# Patient Record
Sex: Female | Born: 1960 | Race: White | Hispanic: No | Smoking: Never smoker
Health system: Southern US, Community
[De-identification: ages and names within clinical notes are randomized; demographics above are authoritative.]

---

## 1999-12-08 ENCOUNTER — Other Ambulatory Visit: Admission: RE | Admit: 1999-12-08 | Discharge: 1999-12-08 | Payer: Self-pay | Admitting: Obstetrics and Gynecology

## 2001-01-01 ENCOUNTER — Other Ambulatory Visit: Admission: RE | Admit: 2001-01-01 | Discharge: 2001-01-01 | Payer: Self-pay | Admitting: Obstetrics and Gynecology

## 2002-01-02 ENCOUNTER — Other Ambulatory Visit: Admission: RE | Admit: 2002-01-02 | Discharge: 2002-01-02 | Payer: Self-pay | Admitting: Obstetrics and Gynecology

## 2002-02-04 ENCOUNTER — Encounter: Admission: RE | Admit: 2002-02-04 | Discharge: 2002-02-04 | Payer: Self-pay | Admitting: Internal Medicine

## 2002-02-04 ENCOUNTER — Encounter: Payer: Self-pay | Admitting: Internal Medicine

## 2002-02-08 ENCOUNTER — Encounter: Admission: RE | Admit: 2002-02-08 | Discharge: 2002-02-08 | Payer: Self-pay | Admitting: Internal Medicine

## 2002-02-08 ENCOUNTER — Encounter: Payer: Self-pay | Admitting: Internal Medicine

## 2003-01-21 ENCOUNTER — Other Ambulatory Visit: Admission: RE | Admit: 2003-01-21 | Discharge: 2003-01-21 | Payer: Self-pay | Admitting: Obstetrics and Gynecology

## 2004-01-23 ENCOUNTER — Other Ambulatory Visit: Admission: RE | Admit: 2004-01-23 | Discharge: 2004-01-23 | Payer: Self-pay | Admitting: Obstetrics and Gynecology

## 2015-10-23 ENCOUNTER — Other Ambulatory Visit (HOSPITAL_BASED_OUTPATIENT_CLINIC_OR_DEPARTMENT_OTHER): Payer: Self-pay | Admitting: Orthopaedic Surgery

## 2015-10-23 DIAGNOSIS — M79642 Pain in left hand: Secondary | ICD-10-CM

## 2015-10-26 ENCOUNTER — Encounter (HOSPITAL_BASED_OUTPATIENT_CLINIC_OR_DEPARTMENT_OTHER): Payer: Self-pay | Admitting: Radiology

## 2015-10-26 ENCOUNTER — Ambulatory Visit (HOSPITAL_BASED_OUTPATIENT_CLINIC_OR_DEPARTMENT_OTHER)
Admission: RE | Admit: 2015-10-26 | Discharge: 2015-10-26 | Disposition: A | Discharge status: Home / Self Care | Payer: BLUE CROSS/BLUE SHIELD | Source: Ambulatory Visit | Attending: Orthopaedic Surgery | Admitting: Orthopaedic Surgery

## 2015-10-26 DIAGNOSIS — M79642 Pain in left hand: Secondary | ICD-10-CM | POA: Insufficient documentation

## 2017-01-04 ENCOUNTER — Other Ambulatory Visit (INDEPENDENT_AMBULATORY_CARE_PROVIDER_SITE_OTHER): Payer: Self-pay

## 2017-01-04 ENCOUNTER — Ambulatory Visit (INDEPENDENT_AMBULATORY_CARE_PROVIDER_SITE_OTHER): Payer: BLUE CROSS/BLUE SHIELD | Admitting: Orthopaedic Surgery

## 2017-01-04 DIAGNOSIS — M79642 Pain in left hand: Secondary | ICD-10-CM | POA: Diagnosis not present

## 2017-01-04 DIAGNOSIS — M79641 Pain in right hand: Secondary | ICD-10-CM

## 2017-01-04 NOTE — Progress Notes (Signed)
Office Visit Note   Patient: Diana Glass           Date of Birth: 05-Oct-1960           MRN: [id number] Visit Date: 01/04/2017              Requested by: Jamal CollinHedgecock, Suzanne, PA-C [address] [place], [place] [address] PCP: No primary care provider on file.   Assessment & Plan: Visit Diagnoses:  1. Pain in left hand   2. Pain in right hand     Plan:Given her multiple joint complaints she may end up needing a rheumatologic referral. However I would like to obtain bilateral upper extremity nerve conduction studies to evaluate for carpal tunnel syndrome given the fact that she gets numbness especially the right side that does wake her up at night and she's been wearing a wrist splint for this. She is agreeable to trying this type of study and then eventually a referral to rheumatologist if needed. All questions concerns were answered and addressed. We'll work on setting up the EMG studies by Dr. Alvester MorinNewton.  Follow-Up Instructions: Return in about 2 weeks (around 01/18/2017).   Orders:  No orders of the defined types were placed in this encounter.  No orders of the defined types were placed in this encounter.     Procedures: No procedures performed   Clinical Data: No additional findings.   Subjective: No chief complaint on file. The patient is someone I have seen before for trigger thumb. However at this point she has bilateral hand. She does wear wrist splint at night on her right hand and does get numbness and tingling on occasion from the elbows down to her hands. Her biggest concern though is whether or not she has rheumatologic process going on. She says her fingers and hand get stiff at night as well as her feet and sometimes her knees. She has seen her primary care physician and had labs obtained and she states that showed a positive ANA. She has x-rays on the canopy system for review of both her hands. She's never had nerve conduction studies before. She takes  anti-inflammatories on an as-needed basis. She is a very slim individual.  HPI  Review of Systems She currently denies any headache, chest pain, shortness of breath, fever, chills, nausea, vomiting. She denies any other systemic illnesses.  Objective: Vital Signs: There were no vitals taken for this visit.  Physical Exam She is alert or 3 and in no acute distress. Ortho Exam Examination of both hands are assessed today and I see no evidence of muscle atrophy. She moves her fingers thumbs and wrists well. I see no significant swelling at the MCP or IP joints. There is not a true positive Tinel's or Phalen's exam bilaterally. She does report some subjective numbness. She states that her fingers are just stiff. Specialty Comments:  No specialty comments available.  Imaging: No results found. I independently reviewed x-rays of both her hands and they're entirely normal in terms of the bony anatomy. The joints are all well maintained. There is no acute findings on either hand or wrist.  PMFS History: Patient Active Problem List   Diagnosis Date Noted  . Pain in left hand 01/04/2017  . Pain in right hand 01/04/2017   No past medical history on file.  No family history on file.  No past surgical history on file. Social History   Occupational History  . Not on file.   Social  History Main Topics  . Smoking status: Not on file  . Smokeless tobacco: Not on file  . Alcohol use Not on file  . Drug use: Unknown  . Sexual activity: Not on file

## 2017-01-18 ENCOUNTER — Encounter (INDEPENDENT_AMBULATORY_CARE_PROVIDER_SITE_OTHER): Payer: Self-pay | Admitting: Physical Medicine and Rehabilitation

## 2017-01-18 ENCOUNTER — Ambulatory Visit (INDEPENDENT_AMBULATORY_CARE_PROVIDER_SITE_OTHER): Payer: BLUE CROSS/BLUE SHIELD | Admitting: Physical Medicine and Rehabilitation

## 2017-01-18 ENCOUNTER — Ambulatory Visit (INDEPENDENT_AMBULATORY_CARE_PROVIDER_SITE_OTHER): Payer: BLUE CROSS/BLUE SHIELD | Admitting: Orthopaedic Surgery

## 2017-01-18 DIAGNOSIS — G5601 Carpal tunnel syndrome, right upper limb: Secondary | ICD-10-CM | POA: Diagnosis not present

## 2017-01-18 DIAGNOSIS — R202 Paresthesia of skin: Secondary | ICD-10-CM

## 2017-01-18 NOTE — Progress Notes (Signed)
The patient is here for follow-up after having nerve conduction studies of her bilateral upper extremities due to bilateral hand pain with the right worse than left.  We felt that she may have a rheumatologic process/condition going on as well given the swelling that she gets in her joints of her fingers.  I talked with Dr. Alvester MorinNewton in detail about her studies.  I shared this with her as well.  Her nerve conduction studies do confirm severe carpal tunnel syndrome of the right upper extremity and only mild in the left upper extremity.  On exam she has a swollen right hand with subjective numbness in the median nerve distribution and pain on all of the finger joints.  She has limited grip strength as well.  I shared with her the results of the nerve conduction studies.  At this point we are recommending a right open carpal tunnel release.  I explained to her in detail with the surgery involves including a thorough discussion of the risk and benefits of surgeries.  I talked about her intraoperative and postoperative course would be as well.  Right now I would not recommend anything for the left side but we would only address the right side operatively.  After this long discussion she decided she does want to have this scheduled.  We will work on setting this up and then we will see her back in 2 weeks postoperative for suture removal.  All questions and concerns were answered and addressed.

## 2017-01-18 NOTE — Progress Notes (Deleted)
Numbness and pain in hands. Numbness is worse with lying down or sitting. Feels like hands are swelling. Right hand is worse. Right hand dominant. Right third finger is the most painful, but numbness is in entire hand.

## 2017-01-19 ENCOUNTER — Encounter (INDEPENDENT_AMBULATORY_CARE_PROVIDER_SITE_OTHER): Payer: Self-pay | Admitting: Physical Medicine and Rehabilitation

## 2017-01-19 NOTE — Procedures (Signed)
EMG & NCV Findings: Evaluation of the right median motor nerve showed prolonged distal onset latency (5.5 ms), reduced amplitude (3.5 mV), and decreased conduction velocity (Elbow-Wrist, 42 m/s).  The left median (across palm) sensory nerve showed prolonged distal peak latency (Wrist, 3.9 ms).  The right median (across palm) sensory nerve showed prolonged distal peak latency (Palm, 5.6 ms).  All remaining nerves (as indicated in the following tables) were within normal limits.  Left vs. Right side comparison data for the median motor nerve indicates abnormal L-R latency difference (1.7 ms), abnormal L-R amplitude difference (65.7 %), and abnormal L-R velocity difference (Elbow-Wrist, 12 m/s).    Needle evaluation of the right abductor pollicis brevis muscle showed increased motor unit amplitude and diminished recruitment.  All remaining muscles (as indicated in the following table) showed no evidence of electrical instability.    Impression: The above electrodiagnostic study is ABNORMAL and reveals evidence of:  1. A severe right median nerve entrapment at the wrist (carpal tunnel syndrome) affecting sensory and motor components.  2.  3. A mild left median nerve entrapment at the wrist (carpal tunnel syndrome) affecting sensory components.   There is no significant electrodiagnostic evidence of any other focal nerve entrapment, brachial plexopathy or generalized peripheral neuropathy.  Recommendations: 1.  Follow-up with referring physician. 2.  Continue current management of symptoms. 3.  Continue use of resting splint at night-time and as needed during the day. 4.  Suggest surgical evaluation.   Nerve Conduction Studies Anti Sensory Summary Table   Stim Site NR Peak (ms) Norm Peak (ms) P-T Amp (V) Norm P-T Amp Site1 Site2 Delta-P (ms) Dist (cm) Vel (m/s) Norm Vel (m/s)  Right Med Ante Brach Cutan Anti Sensory (Med Forearm)  34C  Elbow    3.0  46.5  Elbow Med Forearm 3.0 0.0    Left  Median Acr Palm Anti Sensory (2nd Digit)  33.4C  Wrist    *3.9 <3.6 34.0 >10 Wrist Palm 2.1 0.0    Palm    1.8 <2.0 54.1         Right Median Acr Palm Anti Sensory (2nd Digit)  33.5C  Wrist    2.1 <3.6 22.3 >10 Wrist Palm 3.5 0.0    Palm    *5.6 <2.0 6.8         Right Radial Anti Sensory (Base 1st Digit)  32C  Wrist    1.8 <3.1 35.6  Wrist Base 1st Digit 1.8 0.0     Motor Summary Table   Stim Site NR Onset (ms) Norm Onset (ms) O-P Amp (mV) Norm O-P Amp Site1 Site2 Delta-0 (ms) Dist (cm) Vel (m/s) Norm Vel (m/s)  Left Median Motor (Abd Poll Brev)  34.4C  Wrist    3.8 <4.2 10.2 >5 Elbow Wrist 3.4 18.5 54 >50  Elbow    7.2  9.9         Right Median Motor (Abd Poll Brev)  31.8C  Wrist    *5.5 <4.2 *3.5 >5 Elbow Wrist 4.2 17.8 *42 >50  Elbow    9.7  3.2         Right Ulnar Motor (Abd Dig Min)  32.1C  Wrist    2.8 <4.2 9.8 >3 B Elbow Wrist 2.8 19.0 68 >53  B Elbow    5.6  10.1  A Elbow B Elbow 0.9 9.0 100 >53  A Elbow    6.5  10.0          EMG   Side  Muscle Nerve Root Ins Act Fibs Psw Amp Dur Poly Recrt Int Dennie BiblePat Comment  Right Abd Poll Brev Median C8-T1 Nml Nml Nml *Incr Nml 0 *Reduced Nml   Right 1stDorInt Ulnar C8-T1 Nml Nml Nml Nml Nml 0 Nml Nml   Right PronatorTeres Median C6-7 Nml Nml Nml Nml Nml 0 Nml Nml   Right Biceps Musculocut C5-6 Nml Nml Nml Nml Nml 0 Nml Nml   Right Deltoid Axillary C5-6 Nml Nml Nml Nml Nml 0 Nml Nml     Nerve Conduction Studies Anti Sensory Left/Right Comparison   Stim Site L Lat (ms) R Lat (ms) L-R Lat (ms) L Amp (V) R Amp (V) L-R Amp (%) Site1 Site2 L Vel (m/s) R Vel (m/s) L-R Vel (m/s)  Med Ante Brach Cutan Anti Sensory (Med Forearm)  34C  Elbow  3.0   46.5  Elbow Med Forearm     Median Acr Palm Anti Sensory (2nd Digit)  33.4C  Wrist *3.9 2.1 1.8 34.0 22.3 34.4 Wrist Palm     Palm 1.8 *5.6 3.8 54.1 6.8 87.4       Radial Anti Sensory (Base 1st Digit)  32C  Wrist  1.8   35.6  Wrist Base 1st Digit      Motor Left/Right Comparison    Stim Site L Lat (ms) R Lat (ms) L-R Lat (ms) L Amp (mV) R Amp (mV) L-R Amp (%) Site1 Site2 L Vel (m/s) R Vel (m/s) L-R Vel (m/s)  Median Motor (Abd Poll Brev)  34.4C  Wrist 3.8 *5.5 *1.7 10.2 *3.5 *65.7 Elbow Wrist 54 *42 *12  Elbow 7.2 9.7 2.5 9.9 3.2 67.7       Ulnar Motor (Abd Dig Min)  32.1C  Wrist  2.8   9.8  B Elbow Wrist  68   B Elbow  5.6   10.1  A Elbow B Elbow  100   A Elbow  6.5   10.0           Waveforms:

## 2017-01-19 NOTE — Progress Notes (Signed)
Diana Glass - 56 y.o. female MRN [id number]  Date of birth: [birth year]  Office Visit Note: Visit Date: 01/18/2017 PCP: Jamal Collin, PA-C Referred by: No ref. provider found  Subjective: Chief Complaint  Patient presents with  . Right Hand - Numbness  . Left Hand - Numbness   HPI: Diana Glass is a 56 year old right-hand-dominant who comes in today for electrodiagnostic study requested by Dr. Magnus Ivan for bilateral hand numbness right more than left.  She reports several months of worsening hand pain and numbness and tingling particularly in the right hand essentially a nondermatomal fashion leading up to the elbow.  She gets worsening symptoms at night and with activity.  She does get some numbness with driving and with holding a phone.  She feels like her hands are swelling and she has a hard time making a fist.  She reports that the right third finger is the most painful but numbness is in the entire hand.  She reports no specific injury.    ROS Otherwise per HPI.  Assessment & Plan: Visit Diagnoses:  1. Paresthesia of skin     Plan: No additional findings.  Impression: The above electrodiagnostic study is ABNORMAL and reveals evidence of:  1. A severe right median nerve entrapment at the wrist (carpal tunnel syndrome) affecting sensory and motor components.  2.  3. A mild left median nerve entrapment at the wrist (carpal tunnel syndrome) affecting sensory components.   There is no significant electrodiagnostic evidence of any other focal nerve entrapment, brachial plexopathy or generalized peripheral neuropathy.  Recommendations: 1.  Follow-up with referring physician. 2.  Continue current management of symptoms. 3.  Continue use of resting splint at night-time and as needed during the day. 4.  Suggest surgical evaluation.   Meds & Orders: No orders of the defined types were placed in this encounter.   Orders Placed This Encounter  Procedures  . NCV with EMG  (electromyography)    Follow-up: Return for Dr. Magnus Ivan.   Procedures: No procedures performed  EMG & NCV Findings: Evaluation of the right median motor nerve showed prolonged distal onset latency (5.5 ms), reduced amplitude (3.5 mV), and decreased conduction velocity (Elbow-Wrist, 42 m/s).  The left median (across palm) sensory nerve showed prolonged distal peak latency (Wrist, 3.9 ms).  The right median (across palm) sensory nerve showed prolonged distal peak latency (Palm, 5.6 ms).  All remaining nerves (as indicated in the following tables) were within normal limits.  Left vs. Right side comparison data for the median motor nerve indicates abnormal L-R latency difference (1.7 ms), abnormal L-R amplitude difference (65.7 %), and abnormal L-R velocity difference (Elbow-Wrist, 12 m/s).    Needle evaluation of the right abductor pollicis brevis muscle showed increased motor unit amplitude and diminished recruitment.  All remaining muscles (as indicated in the following table) showed no evidence of electrical instability.    Impression: The above electrodiagnostic study is ABNORMAL and reveals evidence of:  4. A severe right median nerve entrapment at the wrist (carpal tunnel syndrome) affecting sensory and motor components.  5.  6. A mild left median nerve entrapment at the wrist (carpal tunnel syndrome) affecting sensory components.   There is no significant electrodiagnostic evidence of any other focal nerve entrapment, brachial plexopathy or generalized peripheral neuropathy.  Recommendations: 1.  Follow-up with referring physician. 2.  Continue current management of symptoms. 3.  Continue use of resting splint at night-time and as needed during the day. 4.  Suggest surgical evaluation.  Nerve Conduction Studies Anti Sensory Summary Table   Stim Site NR Peak (ms) Norm Peak (ms) P-T Amp (V) Norm P-T Amp Site1 Site2 Delta-P (ms) Dist (cm) Vel (m/s) Norm Vel (m/s)  Right Med Ante  Brach Cutan Anti Sensory (Med Forearm)  34C  Elbow    3.0  46.5  Elbow Med Forearm 3.0 0.0    Left Median Acr Palm Anti Sensory (2nd Digit)  33.4C  Wrist    *3.9 <3.6 34.0 >10 Wrist Palm 2.1 0.0    Palm    1.8 <2.0 54.1         Right Median Acr Palm Anti Sensory (2nd Digit)  33.5C  Wrist    2.1 <3.6 22.3 >10 Wrist Palm 3.5 0.0    Palm    *5.6 <2.0 6.8         Right Radial Anti Sensory (Base 1st Digit)  32C  Wrist    1.8 <3.1 35.6  Wrist Base 1st Digit 1.8 0.0     Motor Summary Table   Stim Site NR Onset (ms) Norm Onset (ms) O-P Amp (mV) Norm O-P Amp Site1 Site2 Delta-0 (ms) Dist (cm) Vel (m/s) Norm Vel (m/s)  Left Median Motor (Abd Poll Brev)  34.4C  Wrist    3.8 <4.2 10.2 >5 Elbow Wrist 3.4 18.5 54 >50  Elbow    7.2  9.9         Right Median Motor (Abd Poll Brev)  31.8C  Wrist    *5.5 <4.2 *3.5 >5 Elbow Wrist 4.2 17.8 *42 >50  Elbow    9.7  3.2         Right Ulnar Motor (Abd Dig Min)  32.1C  Wrist    2.8 <4.2 9.8 >3 B Elbow Wrist 2.8 19.0 68 >53  B Elbow    5.6  10.1  A Elbow B Elbow 0.9 9.0 100 >53  A Elbow    6.5  10.0          EMG   Side Muscle Nerve Root Ins Act Fibs Psw Amp Dur Poly Recrt Int Dennie BiblePat Comment  Right Abd Poll Brev Median C8-T1 Nml Nml Nml *Incr Nml 0 *Reduced Nml   Right 1stDorInt Ulnar C8-T1 Nml Nml Nml Nml Nml 0 Nml Nml   Right PronatorTeres Median C6-7 Nml Nml Nml Nml Nml 0 Nml Nml   Right Biceps Musculocut C5-6 Nml Nml Nml Nml Nml 0 Nml Nml   Right Deltoid Axillary C5-6 Nml Nml Nml Nml Nml 0 Nml Nml     Nerve Conduction Studies Anti Sensory Left/Right Comparison   Stim Site L Lat (ms) R Lat (ms) L-R Lat (ms) L Amp (V) R Amp (V) L-R Amp (%) Site1 Site2 L Vel (m/s) R Vel (m/s) L-R Vel (m/s)  Med Ante Brach Cutan Anti Sensory (Med Forearm)  34C  Elbow  3.0   46.5  Elbow Med Forearm     Median Acr Palm Anti Sensory (2nd Digit)  33.4C  Wrist *3.9 2.1 1.8 34.0 22.3 34.4 Wrist Palm     Palm 1.8 *5.6 3.8 54.1 6.8 87.4       Radial Anti Sensory (Base  1st Digit)  32C  Wrist  1.8   35.6  Wrist Base 1st Digit      Motor Left/Right Comparison   Stim Site L Lat (ms) R Lat (ms) L-R Lat (ms) L Amp (mV) R Amp (mV) L-R Amp (%) Site1 Site2 L Vel (m/s) R Vel (m/s) L-R Vel (m/s)  Median Motor (Abd Poll Brev)  34.4C  Wrist 3.8 *5.5 *1.7 10.2 *3.5 *65.7 Elbow Wrist 54 *42 *12  Elbow 7.2 9.7 2.5 9.9 3.2 67.7       Ulnar Motor (Abd Dig Min)  32.1C  Wrist  2.8   9.8  B Elbow Wrist  68   B Elbow  5.6   10.1  A Elbow B Elbow  100   A Elbow  6.5   10.0           Waveforms:                Clinical History: No specialty comments available.  She reports that she has never smoked. She has never used smokeless tobacco. No results for input(s): HGBA1C, LABURIC in the last 8760 hours.  Objective:  VS:  HT:    WT:   BMI:     BP:   HR: bpm  TEMP: ( )  RESP:  Physical Exam  Musculoskeletal:  Inspection reveals mild flattening of the right APB but no atrophy of the bilateral APB or FDI or hand intrinsics. There is no swelling, color changes, allodynia or dystrophic changes. There is 5 out of 5 strength in the bilateral wrist extension, finger abduction and long finger flexion.  There is a negative Hoffmann's test bilaterally.    Ortho Exam Imaging: No results found.  Past Medical/Family/Surgical/Social History: Medications & Allergies reviewed per EMR Patient Active Problem List   Diagnosis Date Noted  . Right carpal tunnel syndrome 01/18/2017  . Pain in left hand 01/04/2017  . Pain in right hand 01/04/2017   History reviewed. No pertinent past medical history. History reviewed. No pertinent family history. History reviewed. No pertinent surgical history. Social History   Occupational History  . Not on file.   Social History Main Topics  . Smoking status: Never Smoker  . Smokeless tobacco: Never Used  . Alcohol use Not on file  . Drug use: Unknown  . Sexual activity: Not on file

## 2017-02-02 DIAGNOSIS — G5601 Carpal tunnel syndrome, right upper limb: Secondary | ICD-10-CM | POA: Diagnosis not present

## 2017-02-13 ENCOUNTER — Encounter (INDEPENDENT_AMBULATORY_CARE_PROVIDER_SITE_OTHER): Payer: Self-pay | Admitting: Orthopaedic Surgery

## 2017-02-13 ENCOUNTER — Ambulatory Visit (INDEPENDENT_AMBULATORY_CARE_PROVIDER_SITE_OTHER): Payer: BLUE CROSS/BLUE SHIELD | Admitting: Orthopaedic Surgery

## 2017-02-13 DIAGNOSIS — Z9889 Other specified postprocedural states: Secondary | ICD-10-CM

## 2017-02-13 NOTE — Progress Notes (Signed)
The patient is now 2 weeks status post a right open carpal tunnel release.  She is doing well overall.  On examination her incision looks good remove the sutures in place gestures.  There is bruising in her palm but she is moving her fingers and thumb.  Most of her numbness of her thumb.  We talked about what activity she can try for her hand.  All questions and concerns were answered and addressed.  I would like to reevaluate her in 4 weeks to see how she is doing overall.  She will get her incision and Steri-Strips wet and shower starting tomorrow.

## 2017-03-16 ENCOUNTER — Encounter (INDEPENDENT_AMBULATORY_CARE_PROVIDER_SITE_OTHER): Payer: Self-pay | Admitting: Orthopaedic Surgery

## 2017-03-16 ENCOUNTER — Ambulatory Visit (INDEPENDENT_AMBULATORY_CARE_PROVIDER_SITE_OTHER): Payer: BLUE CROSS/BLUE SHIELD | Admitting: Orthopaedic Surgery

## 2017-03-16 DIAGNOSIS — G5601 Carpal tunnel syndrome, right upper limb: Secondary | ICD-10-CM

## 2017-03-16 DIAGNOSIS — Z9889 Other specified postprocedural states: Secondary | ICD-10-CM

## 2017-03-16 NOTE — Progress Notes (Signed)
The patient is between 5 and 6 weeks status post a right open carpal tunnel release due to moderate severe carpal tunnel syndrome.  She does have moderate carpal tunnel syndrome on the left side.  She says she is doing well on the right side and she feels like the fingers are tight but her strength and range of motion are improving.  On examination she does have some slight swelling but there is no evidence of infection.  Her incision is inflamed but nothing worrisome.  She is improving range of motion of her wrist and her hands working her dexterity and coordination.  I showed her some exercises to try.  She wants to hold off on carpal tunnel surgery on the left side and I agree with this.  I would like to see her back though in 4 weeks to see how she is doing in terms of the progress on the right side.  All questions and concerns were answered and addressed.

## 2017-04-13 ENCOUNTER — Ambulatory Visit (INDEPENDENT_AMBULATORY_CARE_PROVIDER_SITE_OTHER): Payer: BLUE CROSS/BLUE SHIELD | Admitting: Orthopaedic Surgery

## 2017-04-13 ENCOUNTER — Encounter (INDEPENDENT_AMBULATORY_CARE_PROVIDER_SITE_OTHER): Payer: Self-pay | Admitting: Orthopaedic Surgery

## 2017-04-13 DIAGNOSIS — Z9889 Other specified postprocedural states: Secondary | ICD-10-CM

## 2017-04-13 NOTE — Progress Notes (Signed)
The patient is now over 8 weeks status post a right open carpal tunnel release to treat moderate severe right carpal tunnel syndrome.  She is doing well on that side.  She has no significant complaints.  She does have some pain when she pushes up on objects.  She said her grip strength and pinch strength are improving in range of motion of her wrist and fingers are improving.  She does have mild carpal tunnel syndrome on nerve conduction studies on the left side.  She is been wearing a night splint and says the symptoms have actually improved somewhat.  On examination of her right hand her incisions healed nicely.  She got excellent grip and pinch strength bilaterally.  Of note she does have a red hue of both her hands and says is been going on for years.  She did have a positive ANA on exam but this may be a false positive she says overall though she is getting much better clinically.  Of note she did have trigger thumb on the left side before the injected that she says doing well for now.  At this point we will release her to follow-up as needed however if her symptoms persist or worsen on the left side she will let us know including the trigger thumb in the carpal tunnel.  All questions and concerns were answered and addressed.

## 2017-11-08 ENCOUNTER — Ambulatory Visit (INDEPENDENT_AMBULATORY_CARE_PROVIDER_SITE_OTHER): Payer: Self-pay

## 2017-11-08 ENCOUNTER — Encounter (INDEPENDENT_AMBULATORY_CARE_PROVIDER_SITE_OTHER): Payer: Self-pay | Admitting: Orthopaedic Surgery

## 2017-11-08 ENCOUNTER — Ambulatory Visit (INDEPENDENT_AMBULATORY_CARE_PROVIDER_SITE_OTHER): Payer: BLUE CROSS/BLUE SHIELD | Admitting: Orthopaedic Surgery

## 2017-11-08 DIAGNOSIS — M654 Radial styloid tenosynovitis [de Quervain]: Secondary | ICD-10-CM | POA: Diagnosis not present

## 2017-11-08 DIAGNOSIS — M25532 Pain in left wrist: Secondary | ICD-10-CM | POA: Diagnosis not present

## 2017-11-08 MED ORDER — METHYLPREDNISOLONE ACETATE 40 MG/ML IJ SUSP
40.0000 mg | INTRAMUSCULAR | Status: AC | PRN
  Administered 2017-11-08: 40 mg

## 2017-11-08 MED ORDER — LIDOCAINE HCL 1 % IJ SOLN
1.0000 mL | INTRAMUSCULAR | Status: AC | PRN
  Administered 2017-11-08: 1 mL

## 2017-11-08 NOTE — Progress Notes (Signed)
Office Visit Note   Patient: Diana SchoolingVicki Buchmann           Date of Birth: 1960-04-22           MRN: [id number] Visit Date: 11/08/2017              Requested by: Jamal CollinHedgecock, Suzanne, PA-C [address] [place], [place] [address] PCP: Jamal CollinHedgecock, Suzanne, PA-C   Assessment & Plan: Visit Diagnoses:  1. Pain in left wrist   2. De Quervain's tenosynovitis, left     Plan: I do feel this is de Quervain's tenosynovitis and she agrees.  I felt was most appropriate to try a steroid injection in this area and she is also agreeable to this.  She will take her naproxen twice daily.  She does have Voltaren gel at home I will try this as well.  All question concerns were answered and addressed.  She will follow-up as needed.  If this continues to bother her after about 6 weeks I would always repeat injection if needed.  We can also try hand therapy if needed.  Follow-Up Instructions: Return if symptoms worsen or fail to improve.   Orders:  Orders Placed This Encounter  Procedures  . Hand/UE Inj  . XR Wrist Complete Left   No orders of the defined types were placed in this encounter.     Procedures: Hand/UE Inj: L extensor compartment 1 for de Quervain's tenosynovitis on 11/08/2017 4:15 PM Medications: 1 mL lidocaine 1 %; 40 mg methylPREDNISolone acetate 40 MG/ML      Clinical Data: No additional findings.   Subjective: Chief Complaint  Patient presents with  . Left Hand - Pain  . Left Wrist - Pain  Patient comes in today with acute left wrist pain she points to the radial styloid area of her wrist as source of her pain.  This started about 4 weeks ago after doing extensive yard work.  She denies any numbness and tingling and is been wearing a small compressive brace.  She does also have a prescription for naproxen but does not use it. HPI  Review of Systems She currently denies any headache, chest pain, shortness of breath, fever, chills, nausea, vomiting.  Objective: Vital  Signs: There were no vitals taken for this visit.  Physical Exam She is alert and oriented x3 and in no acute distress Ortho Exam Examination of her left wrist shows pain over the radial styloid with a positive Finkelstein test.  The remainder of her left upper extremity and hand exam are normal. Specialty Comments:  No specialty comments available.  Imaging: Xr Wrist Complete Left  Result Date: 11/08/2017 3 views of the left wrist show no acute findings and normal alignment of the bony anatomy.    PMFS History: Patient Active Problem List   Diagnosis Date Noted  . De Quervain's tenosynovitis, left 11/08/2017  . Status post carpal tunnel release 02/13/2017  . Right carpal tunnel syndrome 01/18/2017  . Pain in left hand 01/04/2017  . Pain in right hand 01/04/2017   History reviewed. No pertinent past medical history.  History reviewed. No pertinent family history.  History reviewed. No pertinent surgical history. Social History   Occupational History  . Not on file  Tobacco Use  . Smoking status: Never Smoker  . Smokeless tobacco: Never Used  Substance and Sexual Activity  . Alcohol use: Not on file  . Drug use: Not on file  . Sexual activity: Not on file

## 2019-07-05 ENCOUNTER — Ambulatory Visit: Payer: Self-pay | Attending: Internal Medicine

## 2019-07-05 DIAGNOSIS — Z23 Encounter for immunization: Secondary | ICD-10-CM

## 2019-07-05 NOTE — Progress Notes (Signed)
   Covid-[facility] Vaccination [facility]  Name:  Diana Glass    MRN: [id number] DOB: December 05, 1960  07/05/2019  Diana Glass was observed post Covid-19 immunization for 15 minutes without incident. She was provided with Vaccine Information Sheet and instruction to access the V-Safe system.   Diana Glass was instructed to call 911 with any severe reactions post vaccine: Marland Kitchen Difficulty breathing  . Swelling of face and throat  . A fast heartbeat  . A bad rash all over body  . Dizziness and weakness   Immunizations Administered    Name Date Dose VIS Date Route   Pfizer COVID-19 Vaccine 07/05/2019 12:22 PM 0.3 mL 03/01/2019 Intramuscular   Manufacturer: [employer], [employer]   Lot: [id number]   NDC: [address]-1

## 2019-07-29 ENCOUNTER — Ambulatory Visit: Payer: Self-pay | Attending: Internal Medicine

## 2019-07-29 DIAGNOSIS — Z23 Encounter for immunization: Secondary | ICD-10-CM

## 2019-07-29 NOTE — Progress Notes (Signed)
   Covid-19 Vaccination Clinic  Name:  Diana Glass    MRN: [id number] DOB: 1960-04-12  07/29/2019  [name] was observed post Covid-19 immunization for 15 minutes without incident. She was provided with Vaccine Information Sheet and instruction to access the V-Safe system.   [name] was instructed to call 911 with any severe reactions post vaccine: Marland Kitchen Difficulty breathing  . Swelling of face and throat  . A fast heartbeat  . A bad rash all over body  . Dizziness and weakness   Immunizations Administered    Name Date Dose VIS Date Route   Pfizer COVID-19 Vaccine 07/29/2019  9:57 AM 0.3 mL 05/15/2018 Intramuscular   Manufacturer: [employer], [employer]   Lot: [id number]   NDC: [address]-9

## 2020-07-20 ENCOUNTER — Other Ambulatory Visit: Payer: Self-pay | Admitting: Obstetrics and Gynecology

## 2020-07-20 DIAGNOSIS — R928 Other abnormal and inconclusive findings on diagnostic imaging of breast: Secondary | ICD-10-CM

## 2020-08-12 ENCOUNTER — Ambulatory Visit: Payer: Self-pay

## 2020-08-12 ENCOUNTER — Other Ambulatory Visit: Payer: Self-pay

## 2020-08-12 ENCOUNTER — Ambulatory Visit
Admission: RE | Admit: 2020-08-12 | Discharge: 2020-08-12 | Disposition: A | Discharge status: Home / Self Care | Payer: BC Managed Care – PPO | Source: Ambulatory Visit | Attending: Obstetrics and Gynecology | Admitting: Obstetrics and Gynecology

## 2020-08-12 DIAGNOSIS — R928 Other abnormal and inconclusive findings on diagnostic imaging of breast: Secondary | ICD-10-CM

## 2021-11-15 ENCOUNTER — Ambulatory Visit: Payer: BC Managed Care – PPO | Admitting: Physician Assistant

## 2021-11-15 ENCOUNTER — Encounter: Payer: Self-pay | Admitting: Physician Assistant

## 2021-11-15 ENCOUNTER — Ambulatory Visit (INDEPENDENT_AMBULATORY_CARE_PROVIDER_SITE_OTHER): Payer: BC Managed Care – PPO

## 2021-11-15 ENCOUNTER — Ambulatory Visit: Payer: Self-pay

## 2021-11-15 DIAGNOSIS — M25562 Pain in left knee: Secondary | ICD-10-CM

## 2021-11-15 DIAGNOSIS — M25561 Pain in right knee: Secondary | ICD-10-CM

## 2021-11-15 MED ORDER — METHYLPREDNISOLONE ACETATE 40 MG/ML IJ SUSP
40.0000 mg | INTRAMUSCULAR | Status: AC | PRN
  Administered 2021-11-15: 40 mg via INTRA_ARTICULAR

## 2021-11-15 MED ORDER — LIDOCAINE HCL 1 % IJ SOLN
3.0000 mL | INTRAMUSCULAR | Status: AC | PRN
  Administered 2021-11-15: 3 mL

## 2021-11-15 NOTE — Progress Notes (Signed)
Office Visit Note   Patient: Diana Glass           Date of Birth: Nov 27, 1960           MRN: [id number] Visit Date: 11/15/2021              Requested by: Jamal Collin, PA-C [address] [place],  [place] [address] PCP: Jamal Collin, PA-C   Assessment & Plan: Visit Diagnoses:  1. Acute pain of right knee   2. Acute pain of left knee     Plan: Both knees were wrapped with Ace bandages she will remove these this evening before returning to bed.  Quad strengthening exercises shown.  We will see her back in 2 weeks to see what type of response she had to the injections.  She is to be mindful of mechanical symptoms.  Questions were encouraged and answered  Follow-Up Instructions: Return in about 2 weeks (around 11/29/2021).   Orders:  Orders Placed This Encounter  Procedures   Large Joint Inj   XR Knee 1-2 Views Left   XR Knee 1-2 Views Right   No orders of the defined types were placed in this encounter.     Procedures: Large Joint Inj: bilateral knee on 11/15/2021 3:40 PM Indications: pain Details: 22 G 1.5 in needle, superolateral approach  Arthrogram: No  Medications (Right): 3 mL lidocaine 1 %; 40 mg methylPREDNISolone acetate 40 MG/ML Aspirate (Right): 17 mL yellow Medications (Left): 3 mL lidocaine 1 %; 40 mg methylPREDNISolone acetate 40 MG/ML Aspirate (Left): 14 mL Outcome: tolerated well, no immediate complications Procedure, treatment alternatives, risks and benefits explained, specific risks discussed. Consent was given by the patient. Immediately prior to procedure a time out was called to verify the correct patient, procedure, equipment, support staff and site/side marked as required. Patient was prepped and draped in the usual sterile fashion.       Clinical Data: No additional findings.   Subjective: Chief Complaint  Patient presents with   Left Knee - Pain   Right Knee - Pain    HPI Diana Glass is a 61 year old female  comes in with bilateral knee pain.  She states that she had pain in the right knee since November 04, 2021.  She had left knee pain for couple days mainly along the lateral aspect of the leg.  Notes the right knee locked and she was unable to bring it out to full extension initially.  Now she is having pain mostly medial aspect of the knee.  Left knee giving way sensation.  She has had no acute injury to either knee.  She does have a history of an ACL repair right knee 1997.  For this most acute onset of right knee pain she has tried ibuprofen rest ice and crutches.  Now is using off-the-shelf knee braces with open patella.  She is nondiabetic.  Review of Systems Denies any fevers chills.  Objective: Vital Signs: There were no vitals taken for this visit.  Physical Exam Constitutional:      Appearance: She is not ill-appearing or diaphoretic.  Pulmonary:     Effort: Pulmonary effort is normal.  Neurological:     Mental Status: She is alert and oriented to person, place, and time.  Psychiatric:        Mood and Affect: Mood normal.     Ortho Exam Bilateral knees: No abnormal warmth erythema.  Slight effusion both knees.  No instability valgus varus stressing of  either knee.  Negative anterior drawer bilaterally.  McMurray's positive on the left negative on the right.  Nontender along medial lateral joint line of both knees.  Good range of motion of both knees with patellofemoral crepitus bilaterally. Specialty Comments:  No specialty comments available.  Imaging: XR Knee 1-2 Views Right  Result Date: 11/15/2021 Right knee: Status post ACL repair with retained hardware distal femur.  No acute fractures.  Knee is well located.  Medial lateral joint line overall well-maintained.  Mild patellofemoral changes.  XR Knee 1-2 Views Left  Result Date: 11/15/2021 Left knee 2 views: No acute fracture.  Slight narrowing of the lateral joint line.  Mild patellofemoral changes.  Knee is located.     PMFS History: Patient Active Problem List   Diagnosis Date Noted   De Quervain's tenosynovitis, left 11/08/2017   Status post carpal tunnel release 02/13/2017   Right carpal tunnel syndrome 01/18/2017   Pain in left hand 01/04/2017   Pain in right hand 01/04/2017   History reviewed. No pertinent past medical history.  History reviewed. No pertinent family history.  History reviewed. No pertinent surgical history. Social History   Occupational History   Not on file  Tobacco Use   Smoking status: Never   Smokeless tobacco: Never  Substance and Sexual Activity   Alcohol use: Not on file   Drug use: Not on file   Sexual activity: Not on file

## 2021-12-06 ENCOUNTER — Encounter: Payer: Self-pay | Admitting: Physician Assistant

## 2021-12-06 ENCOUNTER — Ambulatory Visit: Payer: BC Managed Care – PPO | Admitting: Physician Assistant

## 2021-12-06 DIAGNOSIS — M1710 Unilateral primary osteoarthritis, unspecified knee: Secondary | ICD-10-CM

## 2021-12-06 DIAGNOSIS — M170 Bilateral primary osteoarthritis of knee: Secondary | ICD-10-CM | POA: Diagnosis not present

## 2021-12-06 NOTE — Progress Notes (Signed)
HPI: Diana Glass returns today for follow-up of both her knees.  She states that she is at least 50% better status post injections both knees.  She states she had increased pain in both knees for 48 hours after the injections.  She is having no mechanical symptoms of either knee at this time.  She has pain in the right knee with ambulation in the left leg whenever she first gets up from a seated position.  She notes that the left knee pain is a catching sensation and points to the quad insertion on the patella as source of her pain.  She notes significant stiffness in the right knee.  She takes Tylenol or Advil but is taking now less due to the decreased pain and also she is having some stomach upset with the Advil.  She is doing a home exercise program as shown.  She is walking for exercise.  Review of systems: See HPI otherwise negative or noncontributory.  Physical exam: General well-developed well-nourished female who ambulates without any assistive device.  Bilateral knees: Good range of motion of both knees.  No instability with valgus.  No effusion either knee.  Patellofemoral crepitus bilateral knees.  Impression: Bilateral knee patellofemoral arthritis  Plan: She will begin using Voltaren gel 4 g 4 times daily to both knees.  Continue work on Forensic scientist.  We will try to obtain approval for supplemental injections both knees have her back once these are available.  Questions were encouraged and answered handout on supplemental injections given.

## 2021-12-07 ENCOUNTER — Telehealth: Payer: Self-pay

## 2021-12-07 NOTE — Telephone Encounter (Signed)
VOB submitted for SynviscOne, bilateral knee  

## 2021-12-07 NOTE — Telephone Encounter (Signed)
Please get auth for bilateral gel injection-gil pt

## 2021-12-17 ENCOUNTER — Telehealth: Payer: Self-pay

## 2021-12-17 NOTE — Telephone Encounter (Signed)
PA has been submitted through Covermymeds Pending# [id number]

## 2022-01-07 ENCOUNTER — Other Ambulatory Visit: Payer: Self-pay

## 2022-01-07 DIAGNOSIS — M25561 Pain in right knee: Secondary | ICD-10-CM

## 2022-01-07 DIAGNOSIS — M25562 Pain in left knee: Secondary | ICD-10-CM

## 2022-10-05 IMAGING — MG DIGITAL DIAGNOSTIC BILAT W/ TOMO W/ CAD
8 series · 8 of 24 positions shown · non-contrast
Comparison: Previous exams including recent screening mammogram
dated 07/08/2020.

CLINICAL DATA: Patient returns today to evaluate a possible RIGHT
breast asymmetry and possible LEFT breast masses questioned on
recent screening mammogram.

EXAM:
DIGITAL DIAGNOSTIC BILATERAL MAMMOGRAM WITH TOMOSYNTHESIS AND CAD
TECHNIQUE: Bilateral digital diagnostic mammography and breast tomosynthesis
was performed. The images were evaluated with computer-aided
detection.

[L ML synth-2D]
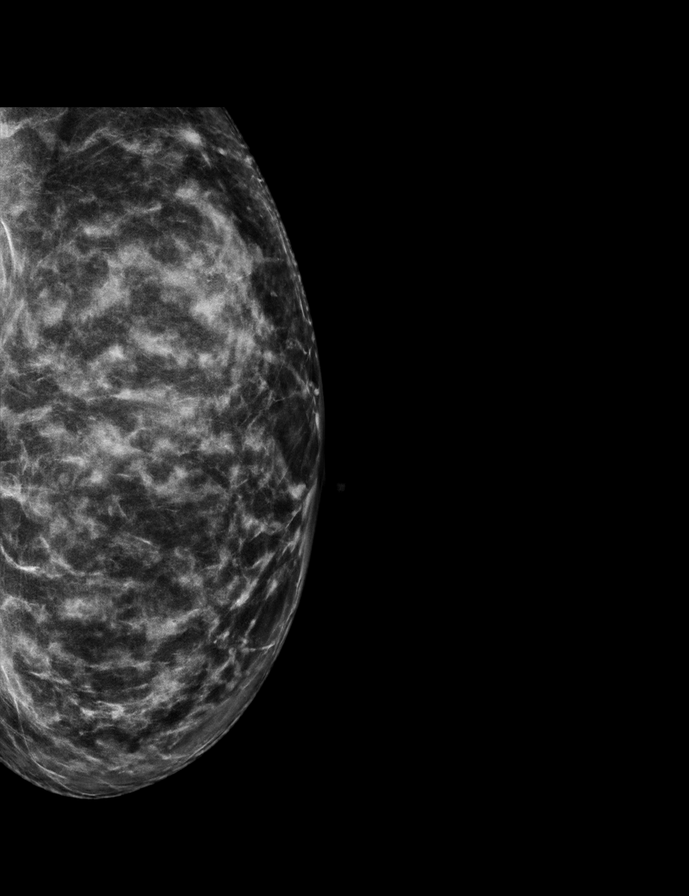

[R ML synth-2D]
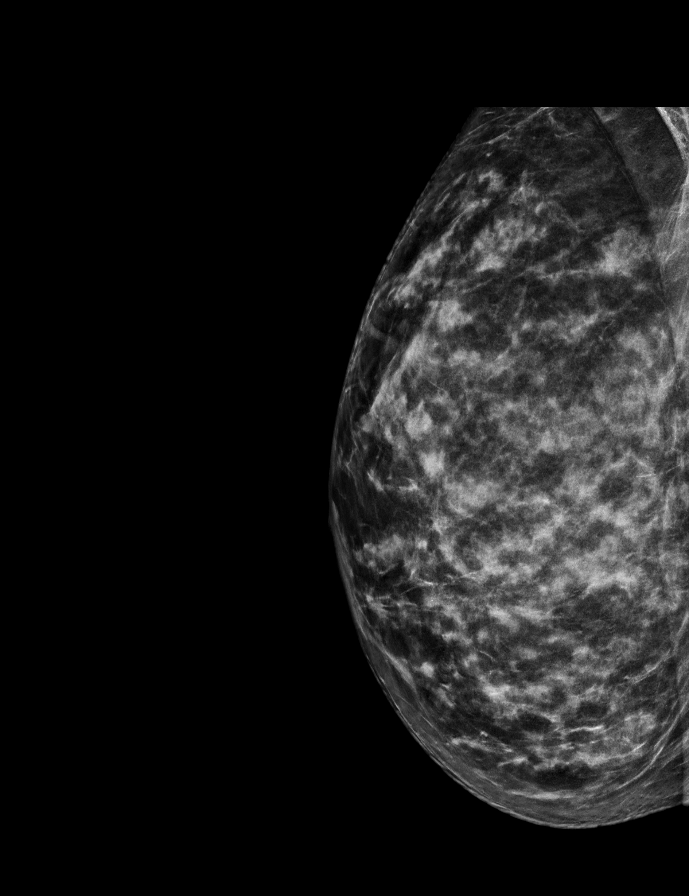

[R CC synth-2D]
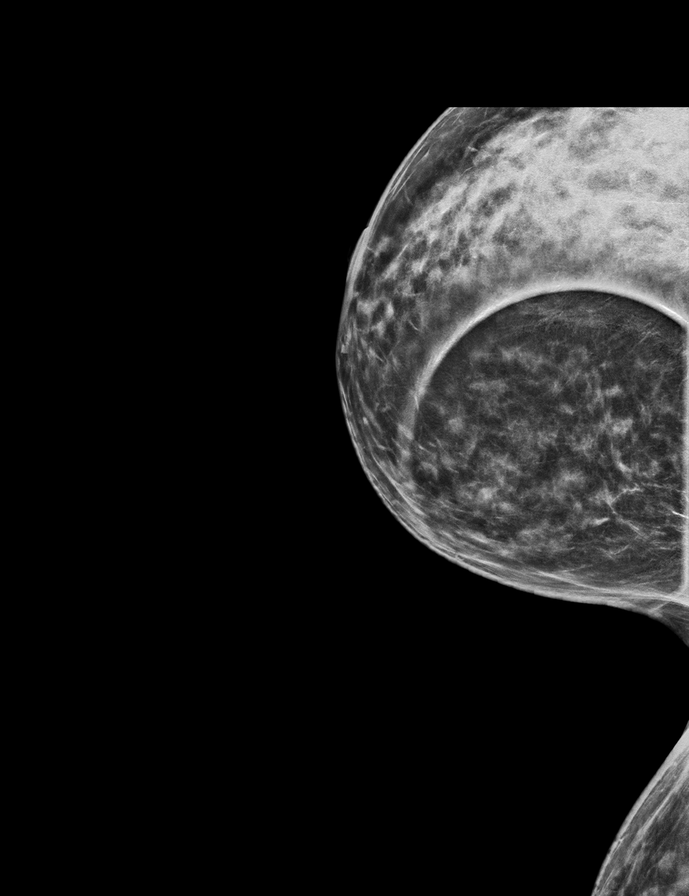

[L MLO synth-2D]
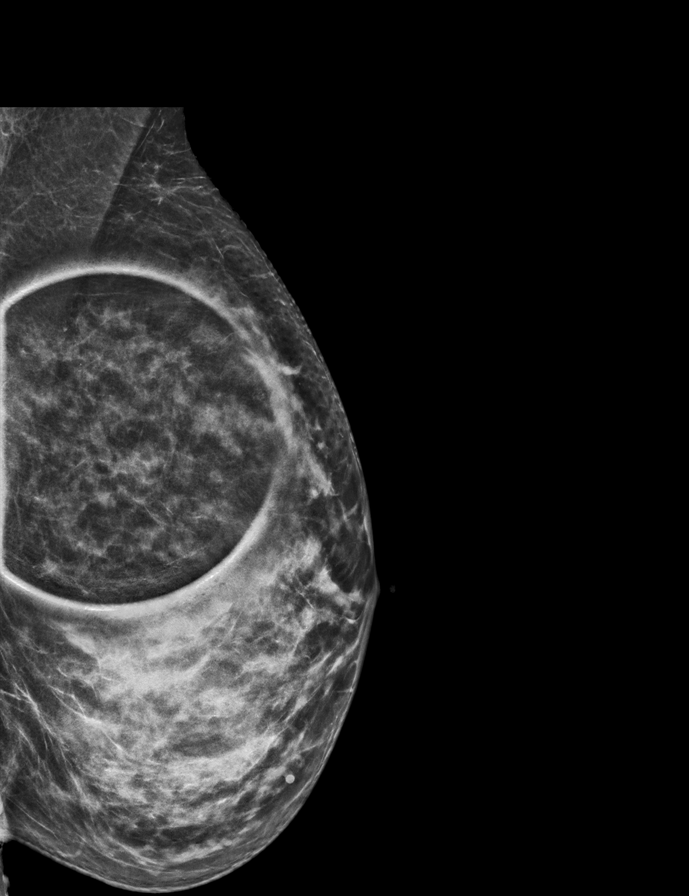

[R ML tomo · tomo slice 33/66.0]
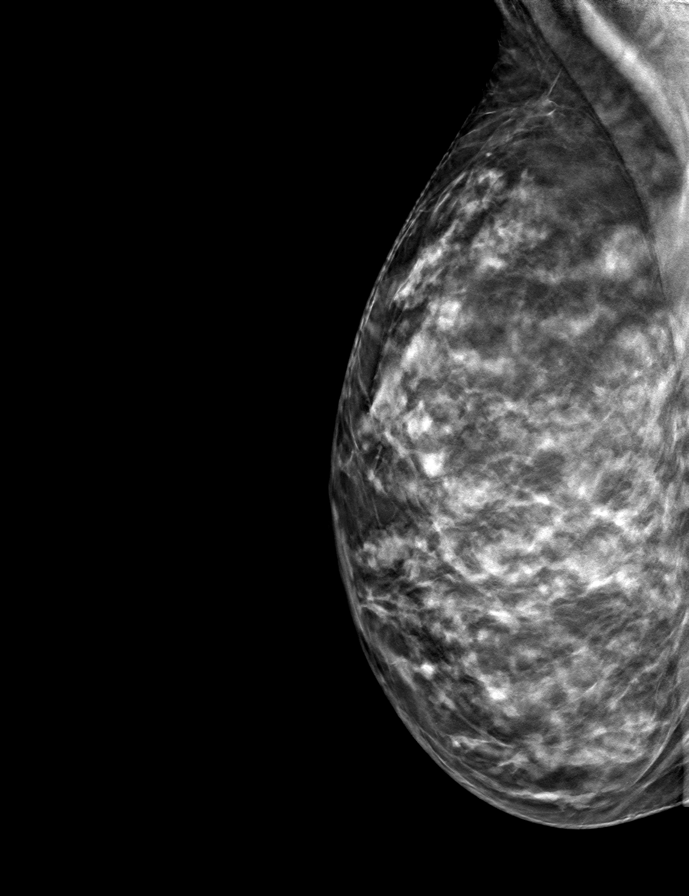

[R CC tomo · tomo slice 31/61.0]
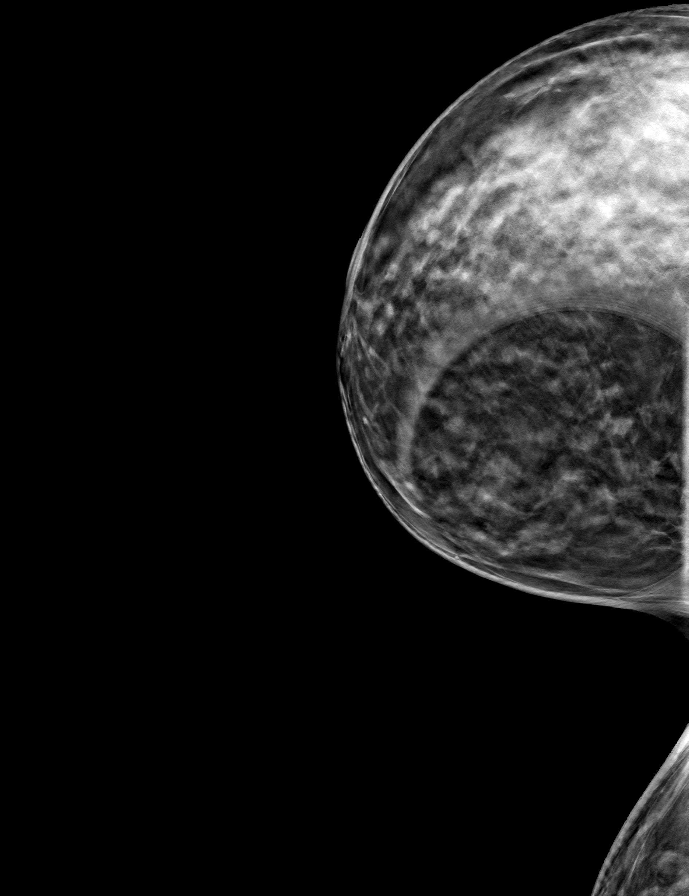

[L MLO tomo · tomo slice 31/60.0]
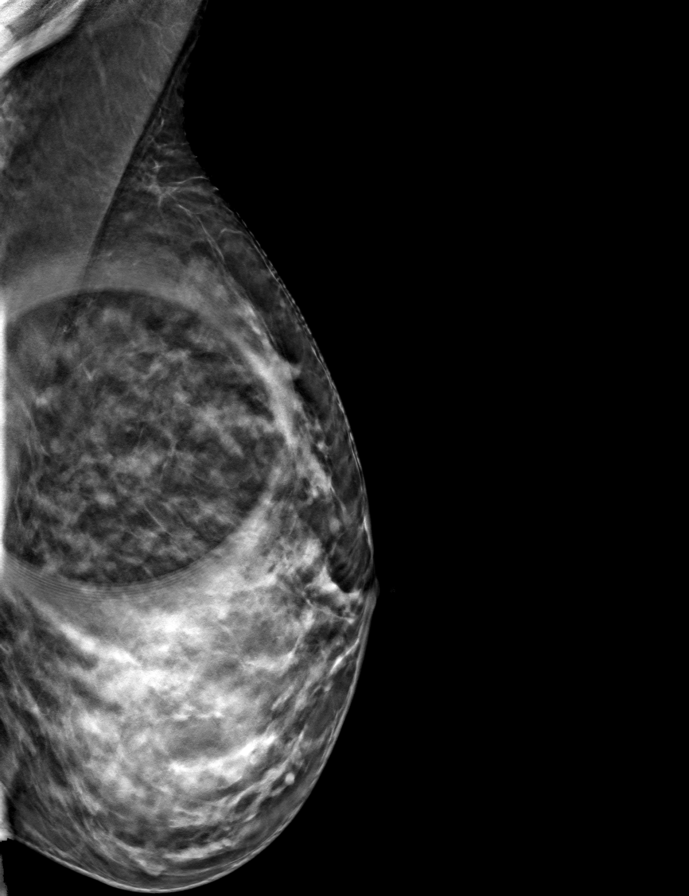

[L ML tomo · tomo slice 33/65.0]
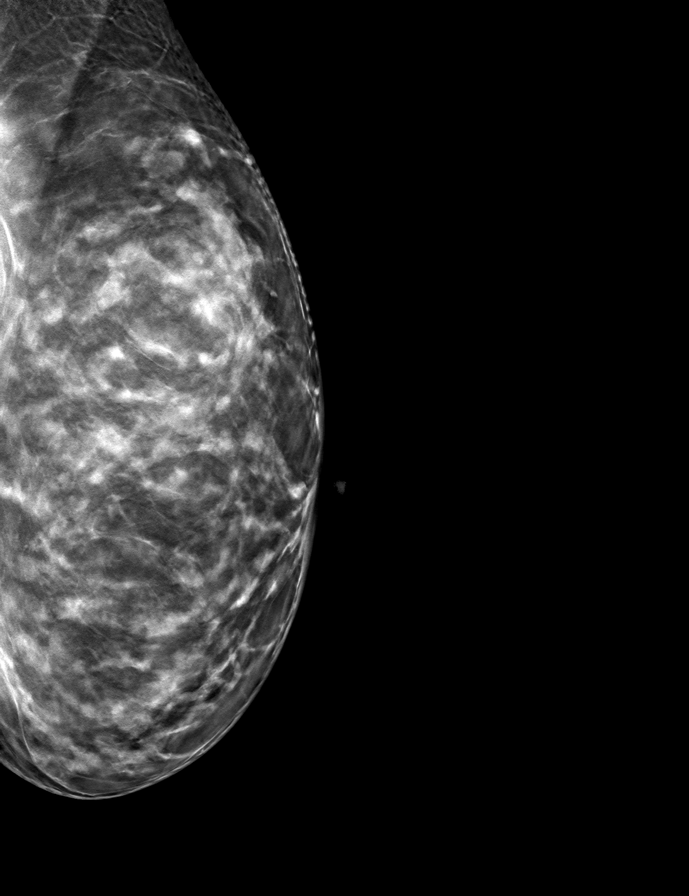

[8 of 24 positions shown; findings below may reference images not displayed]

ACR Breast Density Category c: The breast tissue is heterogeneously
dense, which may obscure small masses.
FINDINGS: RIGHT breast: On today's additional diagnostic views, including spot
compression with 3D tomosynthesis, there is no persistent asymmetry
within the inner RIGHT breast indicating superimposition of normal
dense fibroglandular tissues.

LEFT breast: On today's additional diagnostic views, including spot
compression with 3D tomosynthesis, there are no suspicious findings
within the LEFT breast and the fibroglandular pattern of the upper
LEFT breast is stable for greater than 4 years confirming benignity.
IMPRESSION: No evidence of malignancy within either breast.

Patient may return to routine annual bilateral screening mammogram
schedule.

RECOMMENDATION:
Screening mammogram in one year.(Code:S7-7-BQY)

I have discussed the findings and recommendations with the patient.
If applicable, a reminder letter will be sent to the patient
regarding the next appointment.

BI-RADS CATEGORY  2: Benign.
